# Patient Record
Sex: Female | Born: 2000 | Hispanic: Yes | Marital: Single | Smoking: Never smoker
Health system: Southern US, Community
[De-identification: ages and names within clinical notes are randomized; demographics above are authoritative.]

## PROBLEM LIST (undated history)

## (undated) DIAGNOSIS — J45909 Unspecified asthma, uncomplicated: Secondary | ICD-10-CM

## (undated) HISTORY — DX: Unspecified asthma, uncomplicated: J45.909

---
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
## 2018-01-01 ENCOUNTER — Other Ambulatory Visit (RURAL_HEALTH_CENTER): Payer: Self-pay | Admitting: Nurse Practitioner

## 2018-01-01 ENCOUNTER — Ambulatory Visit: Payer: 59 | Attending: Nurse Practitioner | Admitting: Nurse Practitioner

## 2018-01-01 ENCOUNTER — Encounter (RURAL_HEALTH_CENTER): Payer: Self-pay | Admitting: Nurse Practitioner

## 2018-01-01 VITALS — BP 118/64 | HR 72 | Temp 98.6°F | Resp 20 | Ht 62.21 in | Wt 127.0 lb

## 2018-01-01 DIAGNOSIS — Z113 Encounter for screening for infections with a predominantly sexual mode of transmission: Secondary | ICD-10-CM

## 2018-01-01 DIAGNOSIS — L299 Pruritus, unspecified: Secondary | ICD-10-CM | POA: Insufficient documentation

## 2018-01-01 DIAGNOSIS — R233 Spontaneous ecchymoses: Secondary | ICD-10-CM | POA: Insufficient documentation

## 2018-01-01 DIAGNOSIS — F419 Anxiety disorder, unspecified: Secondary | ICD-10-CM

## 2018-01-01 DIAGNOSIS — Z30011 Encounter for initial prescription of contraceptive pills: Secondary | ICD-10-CM | POA: Insufficient documentation

## 2018-01-01 LAB — POCT PREGNANCY TEST, URINE HCG: POCT Pregnancy HCG Test, UR: NEGATIVE

## 2018-01-01 NOTE — Patient Instructions (Addendum)
Take celexa (citalopram) every day for anxiety. This may not start working until you have been taking it for 4 weeks.  Take atarax (hydroxizine) when you need it for panic attacks.    Take birth control every day.   USE A CONDOM WHENEVER YOU HAVE SEX TO PREVENT STDs!    Change your laundry detergent back to the old kind.

## 2018-01-01 NOTE — Progress Notes (Signed)
Bonnie Smith is present with two concerns: 1) she has been experiencing a reoccurrence of panic attacks, states that she was instructed by previous doctor to come to clinic and request medication if they should return. 2) the skin of her lower back and now hips has been very itching and she has noticed bruising from scratching.

## 2018-01-01 NOTE — Progress Notes (Signed)
HPI: Bonnie Smith is a 17 y.o. female who is here with several concerns.    She has a history of anxiety and panic attacks, which has been going on "for a while now." This was addressed at her last physical appointment but she was told that if her symptoms continued then she should come back to discuss medication options. Bonnie Smith states that she continues to have anxiety. She is becoming acutely anxious most days in many situations, particularly if the involve crowds or other people. She sometimes has a hard time at school, and she frequently has panic attacks at work. She finds herself avoiding situations as a result of this. She describes episodes of panic where she experiences tachycardia and palpitations, shortness of breath, and acute anxiety. She will often become very frustrated, angry, and cry. She has had outbursts at work as a result. These episodes last up to 10 minutes before resolving. She endorses some intermittent depressed mood and anhedonia. She states that she struggles with interpersonal relationships at home with her family. She has had thoughts of self-harm (cutting) in the past, the most recent being several months ago. She has not had any thoughts of suicide.    She has lost a significant amount of weight by "not eating." She states that she "used to be fat" and feels better about her body now. However, she continues to frequently skip meals or will eat very little in an attempt to maintain her weight.    Over the past 2 weeks she has had itching up her lower back and bilateral hips. She has been scratching so hard that she has now developed excoriation and bruising down her hips. She does not have itching anywhere else. She denies any vaginal or anal itching. Denies vaginal discharge or lesions. She has not had any known exposures. No family members are itching. She did recently change her laundry detergent.    She is sexually active with a new partner (about 2 weeks ago). She has had 4  lifetime partners. She reports using condoms with every sexual encounter. Her LMP was 3 weeks ago. She has regular menses without heavy bleeding or significant cramping.    She is interested in starting birth control. She has no history of headaches or migraine with aura. She has no family history of clotting disorders. She denies cigarette use but does endorse frequent vaping.    ROS:  See HPI    Review of Systems   Constitutional: Negative.    HENT: Negative.    Eyes: Negative.    Respiratory: Negative.    Cardiovascular: Negative.    Gastrointestinal: Negative.    Endocrine: Negative.    Genitourinary: Negative.    Musculoskeletal: Negative.    Skin: Positive for rash and wound. Negative for color change and pallor.   Neurological: Negative.    Psychiatric/Behavioral: Positive for dysphoric mood and self-injury. Negative for agitation, sleep disturbance and suicidal ideas. The patient is nervous/anxious.        Past medical history, family history, and social history were all reviewed per the chart.    Physical Exam:    Vitals:    01/01/18 1516   BP: 118/64   Pulse: 72   Resp: 20   Temp: 98.6 F (37 C)       Physical Exam   Constitutional: She is oriented to person, place, and time. She appears well-developed and well-nourished. No distress.   HENT:   Head: Normocephalic and atraumatic.   Right Ear: External  ear normal.   Left Ear: External ear normal.   Nose: Nose normal.   Mouth/Throat: Oropharynx is clear and moist. No oropharyngeal exudate.   Eyes: Pupils are equal, round, and reactive to light. Conjunctivae and EOM are normal.   Neck: Normal range of motion. Neck supple. No thyromegaly present.   Cardiovascular: Normal rate, regular rhythm and normal heart sounds.  Exam reveals no gallop and no friction rub.    No murmur heard.  Pulmonary/Chest: Effort normal and breath sounds normal. No respiratory distress. She has no wheezes. She has no rales.   Abdominal: Soft. Bowel sounds are normal. She exhibits no  distension. There is no tenderness.   Musculoskeletal: Normal range of motion.   Lymphadenopathy:     She has no cervical adenopathy.   Neurological: She is alert and oriented to person, place, and time.   Skin: Skin is warm and dry. Abrasion, petechiae and rash noted.   Bilateral hips and lateral buttocks with significant excoriation and petechiae   Psychiatric: She has a normal mood and affect. Her behavior is normal.       Assessment/Plan:  1. Anxiety     2. Petechiae  CBC and differential    Comprehensive Metabolic Panel    Lipid Panel    TSH, Abn Reflex to Free T4, Serum   3. Itching  CBC and differential    Comprehensive Metabolic Panel    Lipid Panel    TSH, Abn Reflex to Free T4, Serum   4. Encounter for initial prescription of contraceptive pills  POCT Pregnancy, urine   5. Screening examination for sexually transmitted disease  Chlamydia/GC BY PCR    HIV Ag/Ab 4th generation     Will start celexa daily for anxiety/depression. Discussed risks of SSRIs including increased risk for SI. Patient contracts for safety and agrees to seek help for any concerning changes in mood or SI.  Use atarax prn for acute anxiety/panic.    Reviewed options for birth control including OCPs, vaginal ring, depo provera, nexplanon and IUDs. Patient wishes to start OCPs at this time. Counseled on the importance of safe sex with barrier protection to prevent against STIs. Urine pregnancy negative today. Will screen for GC/chlaymidia and HIV today.    Will give topical steroid to help with pruritis. I suspect this is a contact dermatitis related to recent switch in laundry detergent. There may also be an anxiety component. Recommended switching back to the other laundry detergent. Call if symptoms worsen or do not improve with topical steroid.    Patient Instructions   Take celexa (citalopram) every day for anxiety. This may not start working until you have been taking it for 4 weeks.  Take atarax (hydroxizine) when you need it for  panic attacks.    Take birth control every day.   USE A CONDOM WHENEVER YOU HAVE SEX TO PREVENT STDs!    Change your laundry detergent back to the old kind.      Return in about 1 month (around 02/01/2018).

## 2018-01-02 LAB — CBC AND DIFFERENTIAL
Baso(Absolute): 0 10*3/uL (ref 0.0–0.2)
Basos: 0 %
Eos: 2 %
Eosinophils Absolute: 0.2 10*3/uL (ref 0.0–0.4)
Hematocrit: 41.6 % (ref 34.0–46.6)
Hemoglobin: 14.5 g/dL (ref 11.1–15.9)
Immature Granulocytes Absolute: 0 10*3/uL (ref 0.0–0.1)
Immature Granulocytes: 0 %
Lymphocytes Absolute: 3.2 10*3/uL — ABNORMAL HIGH (ref 0.7–3.1)
Lymphocytes: 34 %
MCH: 30.7 pg (ref 26.6–33.0)
MCHC: 34.9 g/dL (ref 31.5–35.7)
MCV: 88 fL (ref 79–97)
Monocytes Absolute: 0.5 10*3/uL (ref 0.1–0.9)
Monocytes: 5 %
Neutrophils Absolute: 5.4 10*3/uL (ref 1.4–7.0)
Neutrophils: 59 %
Platelets: 244 10*3/uL (ref 150–379)
RBC: 4.72 x10E6/uL (ref 3.77–5.28)
RDW: 12.9 % (ref 12.3–15.4)
WBC: 9.3 10*3/uL (ref 3.4–10.8)

## 2018-01-02 LAB — COMPREHENSIVE METABOLIC PANEL
ALT: 14 IU/L (ref 0–32)
AST (SGOT): 17 IU/L (ref 0–40)
Albumin/Globulin Ratio: 1.7 (ref 1.2–2.2)
Albumin: 4.7 g/dL (ref 3.5–5.5)
Alkaline Phosphatase: 62 IU/L (ref 43–101)
BUN / Creatinine Ratio: 14 (ref 9–23)
BUN: 9 mg/dL (ref 6–20)
Bilirubin, Total: 0.3 mg/dL (ref 0.0–1.2)
CO2: 23 mmol/L (ref 20–29)
Calcium: 9.5 mg/dL (ref 8.7–10.2)
Chloride: 101 mmol/L (ref 96–106)
Creatinine: 0.63 mg/dL (ref 0.57–1.00)
Globulin, Total: 2.7 g/dL (ref 1.5–4.5)
Glucose: 84 mg/dL (ref 65–99)
Potassium: 4.2 mmol/L (ref 3.5–5.2)
Protein, Total: 7.4 g/dL (ref 6.0–8.5)
Sodium: 141 mmol/L (ref 134–144)

## 2018-01-02 LAB — REFLEX - T4,FREE (DIRECT): T4, Free: 1.5 ng/dL (ref 0.93–1.60)

## 2018-01-02 LAB — LIPID PANEL
Cholesterol / HDL Ratio: 2.4 ratio (ref 0.0–4.4)
Cholesterol: 156 mg/dL (ref 100–169)
HDL: 65 mg/dL (ref >39)
LDL Calculated: 83 mg/dL (ref 0–109)
Triglycerides: 42 mg/dL (ref 0–89)
VLDL Calculated: 8 mg/dL (ref 5–40)

## 2018-01-02 LAB — THYROID STIMULATING HORMONE (TSH), REFLEX ON ABNORMAL TO FREE T4, SERUM: TSH: 0.362 u[IU]/mL — ABNORMAL LOW (ref 0.450–4.500)

## 2018-01-02 LAB — HIV-1/2 AG/AB 4TH GEN. W/ REFLEX: HIV Screen 4th Generation wRfx: NONREACTIVE

## 2018-01-02 MED ORDER — HYDROCORTISONE 2.5 % EX CREA
TOPICAL_CREAM | Freq: Three times a day (TID) | CUTANEOUS | 0 refills | Status: DC
Start: 2018-01-02 — End: 2018-01-03

## 2018-01-02 MED ORDER — HYDROXYZINE HCL 10 MG PO TABS
10.00 mg | ORAL_TABLET | Freq: Three times a day (TID) | ORAL | 0 refills | Status: DC | PRN
Start: 2018-01-02 — End: 2018-02-05

## 2018-01-02 MED ORDER — CITALOPRAM HYDROBROMIDE 10 MG PO TABS
10.00 mg | ORAL_TABLET | Freq: Every day | ORAL | 0 refills | Status: DC
Start: 2018-01-02 — End: 2018-02-05

## 2018-01-02 MED ORDER — LEVONORGEST-ETH ESTRAD 91-DAY 0.15-0.03 &0.01 MG PO TABS
1.00 | ORAL_TABLET | Freq: Every day | ORAL | 0 refills | Status: AC
Start: 2018-01-02 — End: ?

## 2018-01-03 ENCOUNTER — Encounter (RURAL_HEALTH_CENTER): Payer: Self-pay | Admitting: Nurse Practitioner

## 2018-01-03 ENCOUNTER — Encounter (INDEPENDENT_AMBULATORY_CARE_PROVIDER_SITE_OTHER): Payer: Self-pay

## 2018-01-03 ENCOUNTER — Ambulatory Visit (INDEPENDENT_AMBULATORY_CARE_PROVIDER_SITE_OTHER): Payer: 59 | Admitting: Family Medicine

## 2018-01-03 VITALS — BP 121/71 | HR 83 | Temp 97.6°F | Resp 18 | Ht 63.0 in | Wt 126.0 lb

## 2018-01-03 DIAGNOSIS — Y9383 Activity, rough housing and horseplay: Secondary | ICD-10-CM

## 2018-01-03 DIAGNOSIS — W228XXA Striking against or struck by other objects, initial encounter: Secondary | ICD-10-CM

## 2018-01-03 DIAGNOSIS — Y9289 Other specified places as the place of occurrence of the external cause: Secondary | ICD-10-CM

## 2018-01-03 DIAGNOSIS — Y998 Other external cause status: Secondary | ICD-10-CM

## 2018-01-03 DIAGNOSIS — S060X0A Concussion without loss of consciousness, initial encounter: Secondary | ICD-10-CM

## 2018-01-03 NOTE — Patient Instructions (Addendum)
Concussion    A concussionis a type of brain injury.It can be caused by a directhit or blowto the head, neck, face, orbody. The force of the blowmakesthe headand brain shake quickly back and forth.In some cases youmaylose consciousness.Depending on the severity of the blow, it will take from a few hours up to a few days to get better. Sometimes symptoms may last a few months or longer. This is called post-concussion syndrome.  At first, you may have a headache, nausea, vomiting, or dizziness. You may also have problems concentrating or remembering things. This is normal.  Symptoms should get better as the hours and days go by. Symptoms that get worse could be a sign of a more seriousbraininjury. This might be a bruise or bleeding in the brain. That's why it's important to watch for the warning signs listed below.  School-age children are more at risk for symptoms that don't go away after a concussion. They should be watched very closely.  Home care  If your injury is mild and there are no serious signs or symptoms, your healthcare provider may recommend that you be watchedat home. If there is evidence that the injury is more serious, you will be watchedin the hospital. Follow these tips to help care for yourself at home:   After a concussion, your healthcare provider may recommend that a family member or friend watchedyou for 12 to 24 hours. They may be told to wake you every few hours during sleep to check for the signs below.   If your face or scalp swells, apply an ice pack for 20 minutes every 1 to 2 hours. Do this until the swelling starts to go down. To make an ice pack, put ice cubes in a plastic bag that seals at the top. Wrap the bag in a clean, thin towel or cloth. Never put ice or an ice pack directly on the skin.   You may use acetaminophen to control pain, unless another pain medicine was prescribed. Don't use aspirin or ibuprofen after a head injury.If you have long-lasting  (chronic)liver or kidney disease, talk with your healthcare providerbefore using these medicines. Also talk with your providerif you ever had a stomach ulcer orgastrointestinal bleeding.   For the next 24 hours:  ? Don't drink alcohol or take sedatives or medicines that make you sleepy.  ? Don't drive or operate machinery.  ? Don't do anything strenuous. Don't lift or strain.   Don't returnright awayto sports ortoany activitywhere you couldhit your head. Waituntil all symptoms are gone and you have been cleared by yourhealthcare provider.Having asecond head injury beforeyoufully recoverfrom the first one can lead to serious brain injury.   After a few days, it's OK to go back to your normal daily activities. But don't do anything that could cause your head to be hit again.  Follow-up care  Follow up with your healthcare providerin 1 week, or as directed.  A radiologist will review any X-rays or CT scans that were taken. You will be told of any new findings that may affect your care.  When to seek medical advice  Call your healthcare provider right awayif any of these occur:   Headacheor dizzinessthat won't go away   Redness, warmth, or pus from the swollen area  Call 911  Call 911 or get medical care right away if any of these occur:   Repeated vomiting (it's common to vomitonce after a head injury)   Headache or dizziness that is severe or gets   worse   Loss of consciousness   Unusual drowsiness, or unable to wake up as usual   Weakness or decreased ability to walk or move any limb   Confusion, agitation,or change in behavior or speech, or memory loss   Blurred vision   Convulsion (seizure)   Swelling on the scalp or face that gets worse   Changes in pupil size (the black part of the eye)   Fluid draining from or bleeding from the nose or ears  Date Last Reviewed: 03/22/2017   2000-2018 [employer] [address], [place] [address]. All rights reserved.  This information is not intended as a substitute for professional medical care. Always follow your healthcare professional's instructions.

## 2018-01-03 NOTE — Progress Notes (Signed)
Subjective:    Patient ID: Bonnie Smith is a 17 y.o. female.    Head Injury    The incident occurred 12 to 24 hours ago. The injury mechanism was a direct blow (was wrestling with her boyfriend and got headbutted ). There was no loss of consciousness. There was no blood loss. The quality of the pain is described as aching and dull. The pain is moderate. The pain has been constant since the injury. Associated symptoms include headaches. Pertinent negatives include no blurred vision, disorientation, memory loss, numbness, tinnitus, vomiting or weakness. She has tried acetaminophen for the symptoms. The treatment provided mild relief.       The following portions of the patient's history were reviewed and updated as appropriate: allergies, current medications, past medical history, past social history, past surgical history and problem list.    Review of Systems   Constitutional: Negative for activity change, appetite change and fatigue.   HENT: Negative for tinnitus.    Eyes: Negative for blurred vision and pain.   Respiratory: Negative for chest tightness and shortness of breath.    Cardiovascular: Negative for chest pain.   Gastrointestinal: Negative for vomiting.   Musculoskeletal: Negative for back pain.   Skin: Negative for rash.   Neurological: Positive for headaches. Negative for dizziness, weakness and numbness.   Psychiatric/Behavioral: Negative for confusion and memory loss.         Objective:      BP 121/71   Pulse 83   Temp 97.6 F (36.4 C) (Oral)   Resp 18   Ht 1.6 m (5\' 3" )   Wt 57.2 kg (126 lb)   LMP 12/20/2017   BMI 22.32 kg/m     Physical Exam   Constitutional: She is oriented to person, place, and time. She appears well-developed and well-nourished. No distress.   HENT:   Head: Normocephalic and atraumatic.   Right Ear: Tympanic membrane, external ear and ear canal normal.   Left Ear: Tympanic membrane, external ear and ear canal normal.   Nose: Nose normal.   Mouth/Throat: Uvula is  midline, oropharynx is clear and moist and mucous membranes are normal.   Eyes: Conjunctivae and EOM are normal.   Neck: Normal range of motion. Neck supple.   Cardiovascular: Normal rate and regular rhythm.    Pulmonary/Chest: Effort normal and breath sounds normal. No respiratory distress. She has no wheezes. She has no rales.   Musculoskeletal: Normal range of motion.   Lymphadenopathy:     She has no cervical adenopathy.   Neurological: She is alert and oriented to person, place, and time. She has normal strength. She is not disoriented. She displays no atrophy, no tremor and normal reflexes. No cranial nerve deficit or sensory deficit. She exhibits normal muscle tone. She displays a negative Romberg sign. Coordination and gait normal. GCS eye subscore is 4. GCS verbal subscore is 5. GCS motor subscore is 6.   Skin: Skin is warm and dry. She is not diaphoretic.   Psychiatric: She has a normal mood and affect. Her speech is normal and behavior is normal. Thought content normal. Cognition and memory are normal.   Nursing note and vitals reviewed.        Assessment:       1. Concussion without loss of consciousness, initial encounter          Plan:       SAC score 30/30; no red flag s/s at this time; no indication for CT based on  PECARN guidelines  Advised rest and fluids; discussed appropriate otc sx tx for use prn. Advised no contact sports until sxs resolved  Discussed concerning s/s for which I have advised the patient to go immediately to the Emergency Department.   Follow up with PCP or RTC if there are any new or worsening symptoms or if the symptoms are lasting longer than expected.  Patient/guardian expressed understanding and agreement with plan of care at time of discharge.         Procedures

## 2018-01-04 LAB — CHLAMYDIA GONORRHOEAE NAA
CHLAMYDIA TRACHOMATIS, NAA: NEGATIVE
Neisseria gonorrhoeae, NAA: NEGATIVE

## 2018-01-06 ENCOUNTER — Telehealth (INDEPENDENT_AMBULATORY_CARE_PROVIDER_SITE_OTHER): Payer: Self-pay

## 2018-01-06 NOTE — Telephone Encounter (Signed)
Called to check on patient after recent visit.    Spoke with patient states she is doing good. No further questions at this time.

## 2018-02-05 ENCOUNTER — Ambulatory Visit: Payer: 59 | Attending: Nurse Practitioner | Admitting: Nurse Practitioner

## 2018-02-05 ENCOUNTER — Encounter (RURAL_HEALTH_CENTER): Payer: Self-pay | Admitting: Nurse Practitioner

## 2018-02-05 VITALS — BP 120/62 | HR 72 | Temp 98.6°F | Resp 14 | Ht 62.6 in | Wt 129.0 lb

## 2018-02-05 DIAGNOSIS — F419 Anxiety disorder, unspecified: Secondary | ICD-10-CM

## 2018-02-05 MED ORDER — CITALOPRAM HYDROBROMIDE 20 MG PO TABS
20.00 mg | ORAL_TABLET | Freq: Every day | ORAL | 3 refills | Status: AC
Start: 2018-02-05 — End: ?

## 2018-02-05 MED ORDER — HYDROXYZINE HCL 10 MG PO TABS
10.00 mg | ORAL_TABLET | Freq: Three times a day (TID) | ORAL | 0 refills | Status: DC | PRN
Start: 2018-02-05 — End: 2018-03-06

## 2018-02-05 NOTE — Progress Notes (Signed)
HPI: Bonnie Smith is a 17 y.o. female who is here for a medication check. Since starting the celexa she's been doing better. The atarax is helpful for panic. She takes it a few times per week at work. She feels like her anxiety is "still there" but more under control. Denies depressed mood, anhedonia, thoughts of self-harm or suicide. She denies any concerning side effects. No headaches, upset stomach. She is sleeping well. The celexa seems to have improved her sleep.    She is doing well on the new OCPs without side effects.      ROS:  See HPI    Review of Systems    Past medical history, family history, and social history were all reviewed per the chart.    Physical Exam:    Vitals:    02/05/18 1421   BP: 120/62   Pulse: 72   Resp: 14   Temp: 98.6 F (37 C)       Physical Exam   Constitutional: She is oriented to person, place, and time. She appears well-developed and well-nourished. No distress.   HENT:   Head: Normocephalic and atraumatic.   Right Ear: External ear normal.   Left Ear: External ear normal.   Nose: Nose normal.   Mouth/Throat: Oropharynx is clear and moist. No oropharyngeal exudate.   Eyes: Pupils are equal, round, and reactive to light. Conjunctivae and EOM are normal.   Neck: Normal range of motion. Neck supple. No thyromegaly present.   Cardiovascular: Normal rate, regular rhythm and normal heart sounds.  Exam reveals no gallop and no friction rub.    No murmur heard.  Pulmonary/Chest: Effort normal and breath sounds normal. No respiratory distress. She has no wheezes. She has no rales.   Abdominal: Soft. Bowel sounds are normal. She exhibits no distension. There is no tenderness.   Musculoskeletal: Normal range of motion.   Lymphadenopathy:     She has no cervical adenopathy.   Neurological: She is alert and oriented to person, place, and time.   Skin: Skin is warm and dry. No rash noted.   Psychiatric: She has a normal mood and affect. Her behavior is normal.       Assessment/Plan:  1.  Anxiety  citalopram (CELEXA) 20 MG tablet    hydrOXYzine (ATARAX) 10 MG tablet     Will increase celexa given good but incomplete response. Continue atarax as needed.    Follow-up in one month and we will recheck thyroid at that visit.    Return in about 1 month (around 03/07/2018) for medication check.

## 2018-03-06 ENCOUNTER — Other Ambulatory Visit (RURAL_HEALTH_CENTER): Payer: Self-pay | Admitting: Nurse Practitioner

## 2018-03-06 ENCOUNTER — Ambulatory Visit: Payer: 59 | Attending: Nurse Practitioner | Admitting: Nurse Practitioner

## 2018-03-06 ENCOUNTER — Encounter (RURAL_HEALTH_CENTER): Payer: Self-pay | Admitting: Nurse Practitioner

## 2018-03-06 VITALS — BP 110/60 | HR 88 | Temp 98.6°F | Resp 20 | Wt 130.0 lb

## 2018-03-06 DIAGNOSIS — F410 Panic disorder [episodic paroxysmal anxiety] without agoraphobia: Secondary | ICD-10-CM

## 2018-03-06 DIAGNOSIS — R899 Unspecified abnormal finding in specimens from other organs, systems and tissues: Secondary | ICD-10-CM

## 2018-03-06 DIAGNOSIS — F419 Anxiety disorder, unspecified: Secondary | ICD-10-CM

## 2018-03-06 DIAGNOSIS — F411 Generalized anxiety disorder: Secondary | ICD-10-CM

## 2018-03-06 MED ORDER — HYDROXYZINE HCL 10 MG PO TABS
10.00 mg | ORAL_TABLET | Freq: Three times a day (TID) | ORAL | 3 refills | Status: AC | PRN
Start: 2018-03-06 — End: ?

## 2018-03-06 NOTE — Progress Notes (Signed)
HPI: Bonnie Smith is a 17 y.o. female who is here for a medication check. Since increasing the celexa she hasn't noticed much of a difference. She feels like her symptoms of anxiety and panic have worsened.  Having increased panic attacks, particularly at work. She gets panicky when she is hot or when things get busy at work. Her heart races, she starts shaking and crying, will often have to go into the walk-in freezer to "cool down." Atarax is helpful. She's taking this every day that she works.  There has been an increase in stress at home. There are strained family relationships.    ROS:  See HPI    Review of Systems    Past medical history, family history, and social history were all reviewed per the chart.    Physical Exam:    Vitals:    03/06/18 1415   BP: 110/60   Pulse: 88   Resp: 20   Temp: 98.6 F (37 C)       Physical Exam   Constitutional: She is oriented to person, place, and time. She appears well-developed and well-nourished. No distress.   HENT:   Head: Normocephalic and atraumatic.   Right Ear: External ear normal.   Left Ear: External ear normal.   Nose: Nose normal.   Mouth/Throat: Oropharynx is clear and moist. No oropharyngeal exudate.   Eyes: Pupils are equal, round, and reactive to light. Conjunctivae and EOM are normal.   Neck: Normal range of motion. Neck supple. No thyromegaly present.   Cardiovascular: Normal rate, regular rhythm and normal heart sounds.  Exam reveals no gallop and no friction rub.    No murmur heard.  Pulmonary/Chest: Effort normal and breath sounds normal. No respiratory distress. She has no wheezes. She has no rales.   Abdominal: Soft. Bowel sounds are normal. She exhibits no distension. There is no tenderness.   Musculoskeletal: Normal range of motion.   Lymphadenopathy:     She has no cervical adenopathy.   Neurological: She is alert and oriented to person, place, and time.   Skin: Skin is warm and dry. No rash noted.   Psychiatric: She has a normal mood and  affect. Her behavior is normal.       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 Abnormal laboratory test result  TSH, Abn Reflex to Free T4, Serum   3. Anxiety  hydrOXYzine (ATARAX) 10 MG tablet     We need to recheck thyroid as she had a low TSH but normal T4 when we checked 2 months ago. 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  I am going to refer to counseling to help with anxiety and panic, particularly given her home stressors.  Continue celexa daily and atarax prn.

## 2018-03-06 NOTE — Progress Notes (Signed)
Bonnie Smith is present for a med check. She is currently taking citalopram 20 mg and hydroxyzine 10 mg. She states that her panic attacks are getting worse.

## 2018-03-07 LAB — THYROID STIMULATING HORMONE (TSH), REFLEX ON ABNORMAL TO FREE T4, SERUM: TSH: 0.563 u[IU]/mL (ref 0.450–4.500)

## 2018-07-01 ENCOUNTER — Encounter (INDEPENDENT_AMBULATORY_CARE_PROVIDER_SITE_OTHER): Payer: Self-pay

## 2018-07-01 ENCOUNTER — Ambulatory Visit (INDEPENDENT_AMBULATORY_CARE_PROVIDER_SITE_OTHER): Payer: Medicaid Other

## 2018-07-01 DIAGNOSIS — Z029 Encounter for administrative examinations, unspecified: Secondary | ICD-10-CM

## 2018-07-10 NOTE — Progress Notes (Signed)
[facility]- wait time too long

## 2019-11-15 ENCOUNTER — Emergency Department (HOSPITAL_COMMUNITY): Payer: No Typology Code available for payment source

## 2019-11-15 ENCOUNTER — Emergency Department (HOSPITAL_COMMUNITY)
Admission: EM | Admit: 2019-11-15 | Discharge: 2019-11-15 | Disposition: A | Discharge status: Home / Self Care | Payer: No Typology Code available for payment source | Attending: Emergency Medicine | Admitting: Emergency Medicine

## 2019-11-15 ENCOUNTER — Encounter (HOSPITAL_COMMUNITY): Payer: Self-pay

## 2019-11-15 ENCOUNTER — Other Ambulatory Visit: Payer: Self-pay

## 2019-11-15 DIAGNOSIS — Z23 Encounter for immunization: Secondary | ICD-10-CM | POA: Diagnosis not present

## 2019-11-15 DIAGNOSIS — W01198A Fall on same level from slipping, tripping and stumbling with subsequent striking against other object, initial encounter: Secondary | ICD-10-CM | POA: Diagnosis not present

## 2019-11-15 DIAGNOSIS — Y9389 Activity, other specified: Secondary | ICD-10-CM | POA: Diagnosis not present

## 2019-11-15 DIAGNOSIS — S81012A Laceration without foreign body, left knee, initial encounter: Secondary | ICD-10-CM | POA: Diagnosis not present

## 2019-11-15 DIAGNOSIS — Y999 Unspecified external cause status: Secondary | ICD-10-CM | POA: Diagnosis not present

## 2019-11-15 DIAGNOSIS — M79641 Pain in right hand: Secondary | ICD-10-CM | POA: Insufficient documentation

## 2019-11-15 DIAGNOSIS — Y9289 Other specified places as the place of occurrence of the external cause: Secondary | ICD-10-CM | POA: Insufficient documentation

## 2019-11-15 DIAGNOSIS — M79644 Pain in right finger(s): Secondary | ICD-10-CM

## 2019-11-15 MED ORDER — DOXYCYCLINE HYCLATE 100 MG PO TABS
100.0000 mg | ORAL_TABLET | Freq: Once | ORAL | Status: AC
  Administered 2019-11-15: 100 mg via ORAL
  Filled 2019-11-15: qty 1

## 2019-11-15 MED ORDER — TETANUS-DIPHTH-ACELL PERTUSSIS 5-2.5-18.5 LF-MCG/0.5 IM SUSP
0.5000 mL | Freq: Once | INTRAMUSCULAR | Status: AC
  Administered 2019-11-15: 0.5 mL via INTRAMUSCULAR
  Filled 2019-11-15: qty 0.5

## 2019-11-15 MED ORDER — LIDOCAINE-EPINEPHRINE (PF) 2 %-1:200000 IJ SOLN
20.0000 mL | Freq: Once | INTRAMUSCULAR | Status: AC
  Administered 2019-11-15: 20 mL
  Filled 2019-11-15: qty 20

## 2019-11-15 MED ORDER — DOXYCYCLINE HYCLATE 100 MG PO CAPS: 100.0000 mg | ORAL_CAPSULE | Freq: Two times a day (BID) | ORAL | 0 refills | Status: AC

## 2019-11-15 NOTE — ED Triage Notes (Signed)
Patient complains of left knee pain with large leg laceration after falling on metal. Saline dressing applied and minimal bleeding.

## 2019-11-15 NOTE — Discharge Instructions (Signed)
1. Medications: Tylenol or ibuprofen for pain 2. Treatment: ice for swelling, keep wound clean with warm soap and water and keep bandage dry. Wear knee immobilizer when walking 3. Follow Up: Please return in 10 days to have your stitches removed or sooner if you have concerns. Return to the emergency department for increased redness, drainage of pus from the wound   WOUND CARE  Remove bandage and wash wound gently with mild soap and warm water.   Continue daily cleansing with soap and water until stitches are removed.  Do not apply any ointments or creams to the wound while stitches are in place, as this may cause delayed healing. Return if you experience any of the following signs of infection: Swelling, redness, pus drainage, streaking, fever >101.0 F  Return if you experience excessive bleeding that does not stop after 15-20 minutes of constant, firm pressure.

## 2019-11-15 NOTE — ED Provider Notes (Signed)
[facility] EMERGENCY DEPARTMENT Provider Note   CSN: [id number] Arrival date & time: 11/15/19  1441     History No chief complaint on file.   Annette Spence is a 19 y.o. female presenting for evaluation of knee laceration.  Patient states approximately 1 hour prior to arrival she tripped over a box, falling forward and landing on her left knee.  Her left knee was cut by something metal.  She denies hitting her head or loss of consciousness.  Patient states she also injured her right middle finger.  She denies injury elsewhere.  She has been scared to ambulate since, but denies significant pain.  She has not taken anything for pain including Tylenol ibuprofen.  She denies numbness or tingling.  She has no medical problems, takes medications daily.  She is not on blood thinners.  She does not know when her last tetanus shot was updated.  HPI     History reviewed. No pertinent past medical history.  There are no problems to display for this patient.   History reviewed. No pertinent surgical history.   OB History   No obstetric history on file.     No family history on file.  Social History   Tobacco Use  . Smoking status: Not on file  Substance Use Topics  . Alcohol use: Not on file  . Drug use: Not on file    Home Medications Prior to Admission medications   Medication Sig Start Date End Date Taking? Authorizing Provider  doxycycline (VIBRAMYCIN) 100 MG capsule Take 1 capsule (100 mg total) by mouth 2 (two) times daily for 5 days. 11/15/19 11/20/19  Karrina Lye, PA-C    Allergies    Patient has no known allergies.  Review of Systems   Review of Systems  Skin: Positive for wound.  Neurological: Negative for numbness.  Hematological: Does not bruise/bleed easily.    Physical Exam Updated Vital Signs BP 112/68   Pulse 67   Temp 97.8 F (36.6 C) (Oral)   Resp 14   Ht 5\' 3"  (1.6 m)   Wt 56.7 kg   LMP 10/29/2019 (Approximate)   SpO2  100%   BMI 22.14 kg/m   Physical Exam Vitals and nursing note reviewed.  Constitutional:      General: She is not in acute distress.    Appearance: She is well-developed.  HENT:     Head: Normocephalic and atraumatic.  Cardiovascular:     Rate and Rhythm: Normal rate and regular rhythm.     Pulses: Normal pulses.  Pulmonary:     Effort: Pulmonary effort is normal. No respiratory distress.     Breath sounds: Normal breath sounds. No wheezing.  Abdominal:     General: There is no distension.  Musculoskeletal:        General: Normal range of motion.     Cervical back: Normal range of motion.     Comments: Full active range of motion of the knee without difficulty.  2+ pedal pulses bilaterally.  Able to perform straight leg raise without difficulty. Ambulatory without difficulty.  Good distal sensation and cap refill.  Skin:    General: Skin is warm and dry.     Capillary Refill: Capillary refill takes less than 2 seconds.     Comments: Approximately 5 cm laceration of the anterior right lower extremity just distal to the knee.  Laceration approximately 1 cm deep.  No active bleeding.  Neurological:     Mental Status: She  is alert and oriented to person, place, and time.     ED Results / Procedures / Treatments   Labs (all labs ordered are listed, but only abnormal results are displayed) Labs Reviewed - No data to display  EKG None  Radiology DG Knee Complete 4 Views Left  Result Date: 11/15/2019 CLINICAL DATA:  Status post trauma. EXAM: LEFT KNEE - COMPLETE 4+ VIEW COMPARISON:  None. FINDINGS: No evidence of fracture, dislocation, or joint effusion. No evidence of arthropathy or other focal bone abnormality. A 0.8 cm x 1.1 cm superficial soft tissue defect is seen along the anterior aspect of the left knee, below the left patella. No radiopaque soft tissue foreign bodies are identified. IMPRESSION: 1. Superficial soft tissue laceration seen inferior to the left patella,  without evidence of an acute osseous abnormality or radiopaque soft tissue foreign body. Electronically Signed   By: Aram Candela M.D.   On: 11/15/2019 16:18   DG Hand Complete Right  Result Date: 11/15/2019 CLINICAL DATA:  Status post trauma. EXAM: RIGHT HAND - COMPLETE 3+ VIEW COMPARISON:  None. FINDINGS: There is no evidence of fracture or dislocation. There is no evidence of arthropathy. A 5 mm bone island is seen within the distal aspect of the proximal phalanx of the fourth right finger. Soft tissues are unremarkable. IMPRESSION: 1. No acute osseous abnormality. Electronically Signed   By: Aram Candela M.D.   On: 11/15/2019 16:34    Procedures .Marland KitchenLaceration Repair  Date/Time: 11/15/2019 5:42 PM Performed by: Alveria Apley, PA-C Authorized by: Alveria Apley, PA-C   Consent:    Consent obtained:  Verbal   Consent given by:  Patient   Risks discussed:  Infection, need for additional repair, nerve damage, poor wound healing, poor cosmetic result, pain, retained foreign body, tendon damage and vascular damage Anesthesia (see MAR for exact dosages):    Anesthesia method:  Local infiltration   Local anesthetic:  Lidocaine 2% WITH epi Laceration details:    Location:  Leg   Leg location:  L knee   Length (cm):  6   Depth (mm):  1 Repair type:    Repair type:  Intermediate Pre-procedure details:    Preparation:  Patient was prepped and draped in usual sterile fashion and imaging obtained to evaluate for foreign bodies Exploration:    Wound exploration: wound explored through full range of motion and entire depth of wound probed and visualized     Wound extent: no foreign bodies/material noted, no nerve damage noted, no tendon damage noted, no underlying fracture noted and no vascular damage noted   Treatment:    Area cleansed with:  Saline   Amount of cleaning:  Standard   Irrigation solution:  Sterile water   Irrigation volume:  500   Irrigation method:   Syringe Skin repair:    Repair method:  Sutures   Suture size:  4-0   Wound skin closure material used: vicryl.   Suture technique:  Simple interrupted and vertical mattress   Number of sutures: 4 vertical mattress, 3 simple interrupted. Approximation:    Approximation:  Close Post-procedure details:    Dressing:  Splint for protection and sterile dressing   Patient tolerance of procedure:  Tolerated well, no immediate complications   (including critical care time)  Medications Ordered in ED Medications  Tdap (BOOSTRIX) injection 0.5 mL (0.5 mLs Intramuscular Given 11/15/19 1633)  doxycycline (VIBRA-TABS) tablet 100 mg (100 mg Oral Given 11/15/19 1633)  lidocaine-EPINEPHrine (XYLOCAINE W/EPI) 2 %-1:200000 (PF) injection  20 mL (20 mLs Infiltration Given 11/15/19 1633)    ED Course  I have reviewed the triage vital signs and the nursing notes.  Pertinent labs & imaging results that were available during my care of the patient were reviewed by me and considered in my medical decision making (see chart for details).    MDM Rules/Calculators/A&P                      Pt presenting for evaluation of left knee laceration.  Physical exam shows patient is neurovascularly intact.  Full active range of motion the knee, as such doubt patellar tendon injury.  Will obtain x-rays due to length and depth of cut.  Tetanus updated today.  X-rays viewed and interpreted by me, no fracture or foreign body.  Wound was numbed and irrigated extensively.  No sign of tendon injury, damage, or encroachment into the capsule.  Laceration repaired as described above.  Discussed aftercare instructions.  Patient placed in knee immobilizer for protection.  At this time, patient appears safe for discharge.  Return precautions given.  Patient states she understands and agrees to plan.  Final Clinical Impression(s) / ED Diagnoses Final diagnoses:  Knee laceration, left, initial encounter  Pain of right middle finger     Rx / DC Orders ED Discharge Orders         Ordered    doxycycline (VIBRAMYCIN) 100 MG capsule  2 times daily     11/15/19 1745           Franchot Heidelberg, PA-C 11/15/19 1840    Davonna Belling, MD 11/15/19 1955

## 2021-02-13 ENCOUNTER — Other Ambulatory Visit: Payer: Self-pay

## 2021-02-13 ENCOUNTER — Ambulatory Visit
Admission: EM | Admit: 2021-02-13 | Discharge: 2021-02-13 | Disposition: A | Discharge status: Home / Self Care | Payer: Self-pay | Attending: Emergency Medicine | Admitting: Emergency Medicine

## 2021-02-13 ENCOUNTER — Encounter: Payer: Self-pay | Admitting: Emergency Medicine

## 2021-02-13 DIAGNOSIS — R519 Headache, unspecified: Secondary | ICD-10-CM

## 2021-02-13 DIAGNOSIS — R5383 Other fatigue: Secondary | ICD-10-CM

## 2021-02-13 DIAGNOSIS — R252 Cramp and spasm: Secondary | ICD-10-CM

## 2021-02-13 MED ORDER — NAPROXEN 500 MG PO TABS: 500.0000 mg | ORAL_TABLET | Freq: Two times a day (BID) | ORAL | 0 refills | Status: AC

## 2021-02-13 MED ORDER — HYDROXYZINE HCL 25 MG PO TABS: 25.0000 mg | ORAL_TABLET | Freq: Four times a day (QID) | ORAL | 0 refills | Status: AC | PRN

## 2021-02-13 NOTE — ED Provider Notes (Signed)
[name]    CSN: [id number] Arrival date & time: 02/13/21  8413      History   Chief Complaint Chief Complaint  Patient presents with  . Weakness  . Fatigue  . Dizziness  . Headache    HPI Annette Spence is a 20 y.o. female presenting today for evaluation of headache.  Reports over the past few years she has had episodes of feeling lightheaded and dizzy with associated fatigue.  Recently at work had worsening spells.  Reports episode yesterday where she felt as if she was about to pass out.  Reports associated shakiness which is constant with associated fatigue.  Reports associated hand cramping and noticed tips of fingers red and swollen at times.  She denies any URI symptoms.  Does report history of anxiety does feel more stressed recently.  Denies any specific triggers.  Denies changes in lifestyle including changes in diet, meds, supplements, sleep.  HPI  History reviewed. No pertinent past medical history.  There are no problems to display for this patient.   History reviewed. No pertinent surgical history.  OB History   No obstetric history on file.      Home Medications    Prior to Admission medications   Medication Sig Start Date End Date Taking? Authorizing Provider  hydrOXYzine (ATARAX/VISTARIL) 25 MG tablet Take 1 tablet (25 mg total) by mouth every 6 (six) hours as needed for anxiety. 02/13/21  Yes Ladelle Teodoro C, PA-C  naproxen (NAPROSYN) 500 MG tablet Take 1 tablet (500 mg total) by mouth 2 (two) times daily. 02/13/21  Yes Monik Lins, Junius Creamer, PA-C    Family History Family History  Problem Relation Age of Onset  . Hypertension Father     Social History Social History   Tobacco Use  . Smoking status: Never Smoker  . Smokeless tobacco: Never Used  Vaping Use  . Vaping Use: Every day  Substance Use Topics  . Alcohol use: Yes  . Drug use: Not Currently     Allergies   Patient has no known allergies.   Review of  Systems Review of Systems  Constitutional: Positive for fatigue. Negative for fever.  HENT: Negative for congestion, sinus pressure and sore throat.   Eyes: Negative for photophobia, pain and visual disturbance.  Respiratory: Negative for cough and shortness of breath.   Cardiovascular: Negative for chest pain.  Gastrointestinal: Negative for abdominal pain, nausea and vomiting.  Genitourinary: Negative for decreased urine volume and hematuria.  Musculoskeletal: Negative for myalgias, neck pain and neck stiffness.  Neurological: Positive for light-headedness and headaches. Negative for dizziness, syncope, facial asymmetry, speech difficulty, weakness and numbness.     Physical Exam Triage Vital Signs ED Triage Vitals  Enc Vitals Group     BP      Pulse      Resp      Temp      Temp src      SpO2      Weight      Height      Head Circumference      Peak Flow      Pain Score      Pain Loc      Pain Edu?      Excl. in GC?    Orthostatic VS for the past 24 hrs:  BP- Lying Pulse- Lying BP- Sitting Pulse- Sitting BP- Standing at 0 minutes Pulse- Standing at 0 minutes  02/13/21 0856 119/68 69 114/73 72 120/77 74  Updated Vital Signs BP 133/79 (BP Location: Left Arm)   Pulse 99   Temp 98.5 F (36.9 C) (Oral)   Resp 17   LMP 01/08/2021   SpO2 99%   Visual Acuity Right Eye Distance:   Left Eye Distance:   Bilateral Distance:    Right Eye Near:   Left Eye Near:    Bilateral Near:     Physical Exam Vitals and nursing note reviewed.  Constitutional:      Appearance: She is well-developed.     Comments: No acute distress  HENT:     Head: Normocephalic and atraumatic.     Ears:     Comments: Bilateral ears without tenderness to palpation of external auricle, tragus and mastoid, EAC's without erythema or swelling, TM's with good bony landmarks and cone of light. Non erythematous.     Nose: Nose normal.     Mouth/Throat:     Comments: Oral mucosa pink and moist,  no tonsillar enlargement or exudate. Posterior pharynx patent and nonerythematous, no uvula deviation or swelling. Normal phonation. Eyes:     Conjunctiva/sclera: Conjunctivae normal.  Cardiovascular:     Rate and Rhythm: Normal rate and regular rhythm.  Pulmonary:     Effort: Pulmonary effort is normal. No respiratory distress.     Comments: Breathing comfortably at rest, CTABL, no wheezing, rales or other adventitious sounds auscultated Abdominal:     General: There is no distension.  Musculoskeletal:        General: Normal range of motion.     Cervical back: Neck supple.  Skin:    General: Skin is warm and dry.  Neurological:     General: No focal deficit present.     Mental Status: She is alert and oriented to person, place, and time. Mental status is at baseline.     Cranial Nerves: No cranial nerve deficit.     Motor: No weakness.     Gait: Gait normal.      UC Treatments / Results  Labs (all labs ordered are listed, but only abnormal results are displayed) Labs Reviewed  CBC  BASIC METABOLIC PANEL  TSH  VITAMIN D 25 HYDROXY (VIT D DEFICIENCY, FRACTURES)    EKG   Radiology No results found.  Procedures Procedures (including critical care time)  Medications Ordered in UC Medications - No data to display  Initial Impression / Assessment and Plan / UC Course  I have reviewed the triage vital signs and the nursing notes.  Pertinent labs & imaging results that were available during my care of the patient were reviewed by me and considered in my medical decision making (see chart for details).     Checking CBC, BMP TSH and vitamin D to further evaluate episodic symptoms, possible anxiety related, providing hydroxyzine to use as needed, Naprosyn for headaches.  No neurodeficits on exam, patient stable at time of visit, recommending rest and fluids.  Discussed strict return precautions. Patient verbalized understanding and is agreeable with plan.  Final  Clinical Impressions(s) / UC Diagnoses   Final diagnoses:  Fatigue, unspecified type  Cramping of hands  Acute nonintractable headache, unspecified headache type     Discharge Instructions     Blood work pending-I will call if abnormal Rest and drink plenty of fluids May use hydroxyzine as needed for anxiety-will cause drowsiness, do not drive or work after taking Naprosyn twice daily as needed for headache Please follow-up with primary care if symptoms persisting, emergency room if symptoms progressing or  worsening    ED Prescriptions    Medication Sig Dispense Auth. Provider   hydrOXYzine (ATARAX/VISTARIL) 25 MG tablet Take 1 tablet (25 mg total) by mouth every 6 (six) hours as needed for anxiety. 12 tablet Makisha Marrin C, PA-C   naproxen (NAPROSYN) 500 MG tablet Take 1 tablet (500 mg total) by mouth 2 (two) times daily. 30 tablet Ladarrell Cornwall, [place] C, PA-C     PDMP not reviewed this encounter.   Lew Dawes, [place] 02/13/21 [phone]

## 2021-02-13 NOTE — ED Triage Notes (Signed)
Pt presents with lightheaded/ dizziness, fatigue and headaches. States shakiness, lightheadedness and fatigue has been going on for years, but headaches are a new symptom.

## 2021-02-13 NOTE — Discharge Instructions (Addendum)
Blood work pending-I will call if abnormal Rest and drink plenty of fluids May use hydroxyzine as needed for anxiety-will cause drowsiness, do not drive or work after taking Naprosyn twice daily as needed for headache Please follow-up with primary care if symptoms persisting, emergency room if symptoms progressing or worsening

## 2021-02-14 LAB — BASIC METABOLIC PANEL
BUN/Creatinine Ratio: 25 — ABNORMAL HIGH (ref 9–23)
BUN: 17 mg/dL (ref 6–20)
CO2: 20 mmol/L (ref 20–29)
Calcium: 9.2 mg/dL (ref 8.7–10.2)
Chloride: 101 mmol/L (ref 96–106)
Creatinine, Ser: 0.67 mg/dL (ref 0.57–1.00)
Glucose: 79 mg/dL (ref 65–99)
Potassium: 4.4 mmol/L (ref 3.5–5.2)
Sodium: 138 mmol/L (ref 134–144)
eGFR: 127 mL/min/{1.73_m2} (ref >59)

## 2021-02-14 LAB — CBC
Hematocrit: 44.9 % (ref 34.0–46.6)
Hemoglobin: 15 g/dL (ref 11.1–15.9)
MCH: 30.3 pg (ref 26.6–33.0)
MCHC: 33.4 g/dL (ref 31.5–35.7)
MCV: 91 fL (ref 79–97)
Platelets: 250 10*3/uL (ref 150–450)
RBC: 4.95 x10E6/uL (ref 3.77–5.28)
RDW: 12.6 % (ref 11.7–15.4)
WBC: 7.9 10*3/uL (ref 3.4–10.8)

## 2021-02-14 LAB — VITAMIN D 25 HYDROXY (VIT D DEFICIENCY, FRACTURES): Vit D, 25-Hydroxy: 30 ng/mL (ref 30.0–100.0)

## 2021-02-14 LAB — TSH: TSH: 0.376 u[IU]/mL — ABNORMAL LOW (ref 0.450–4.500)

## 2021-06-08 IMAGING — CR DG KNEE COMPLETE 4+V*L*
4 series · 4 of 4 positions shown · non-contrast
Comparison: None.

CLINICAL DATA: Status post trauma.

EXAM:
LEFT KNEE - COMPLETE 4+ VIEW

[knee ap]
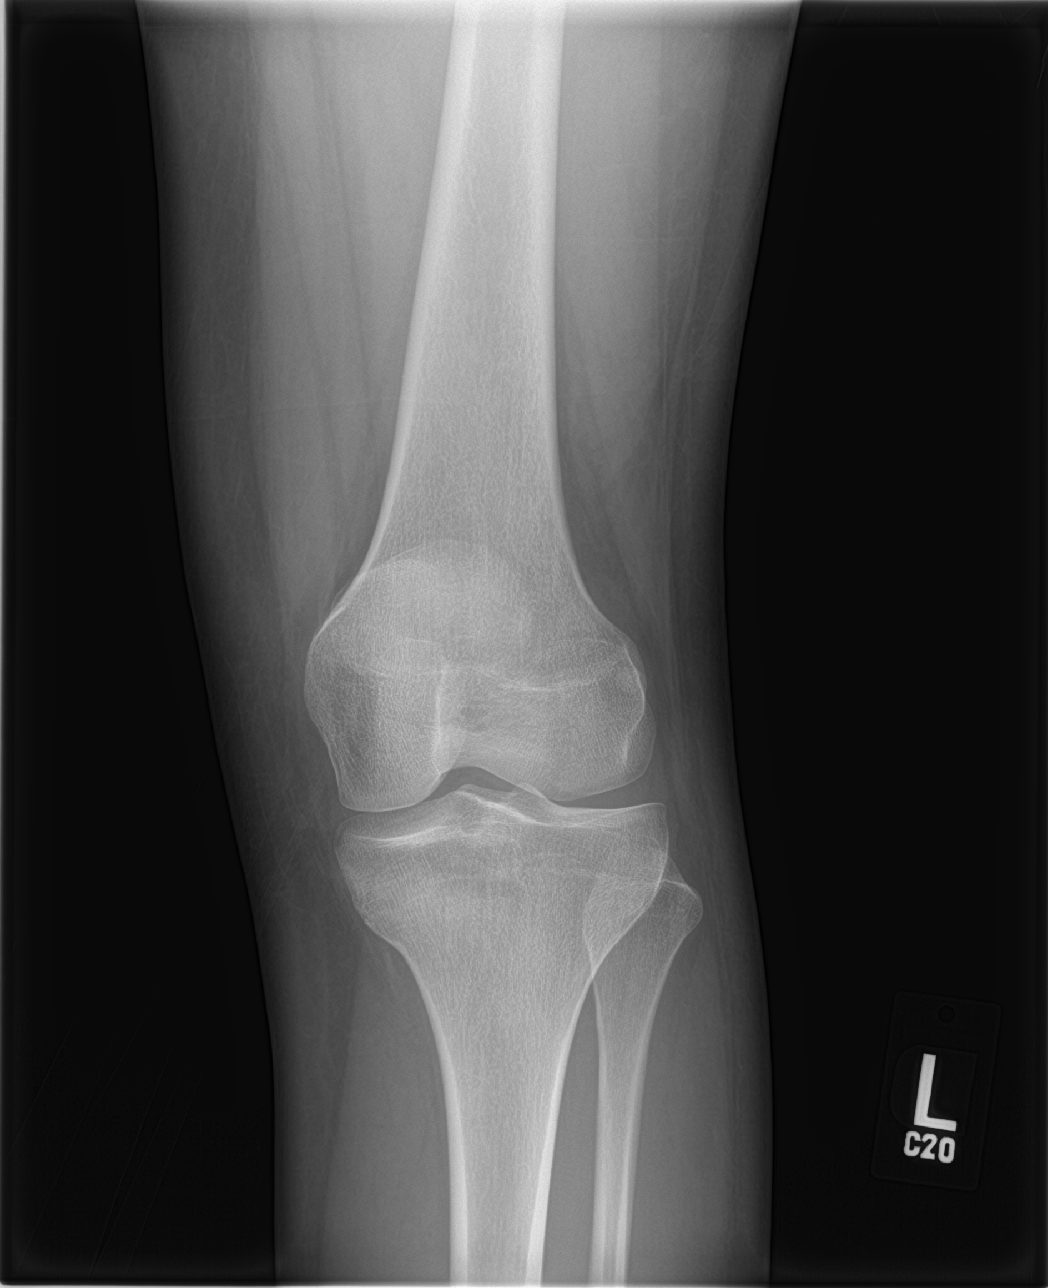

[knee lat]
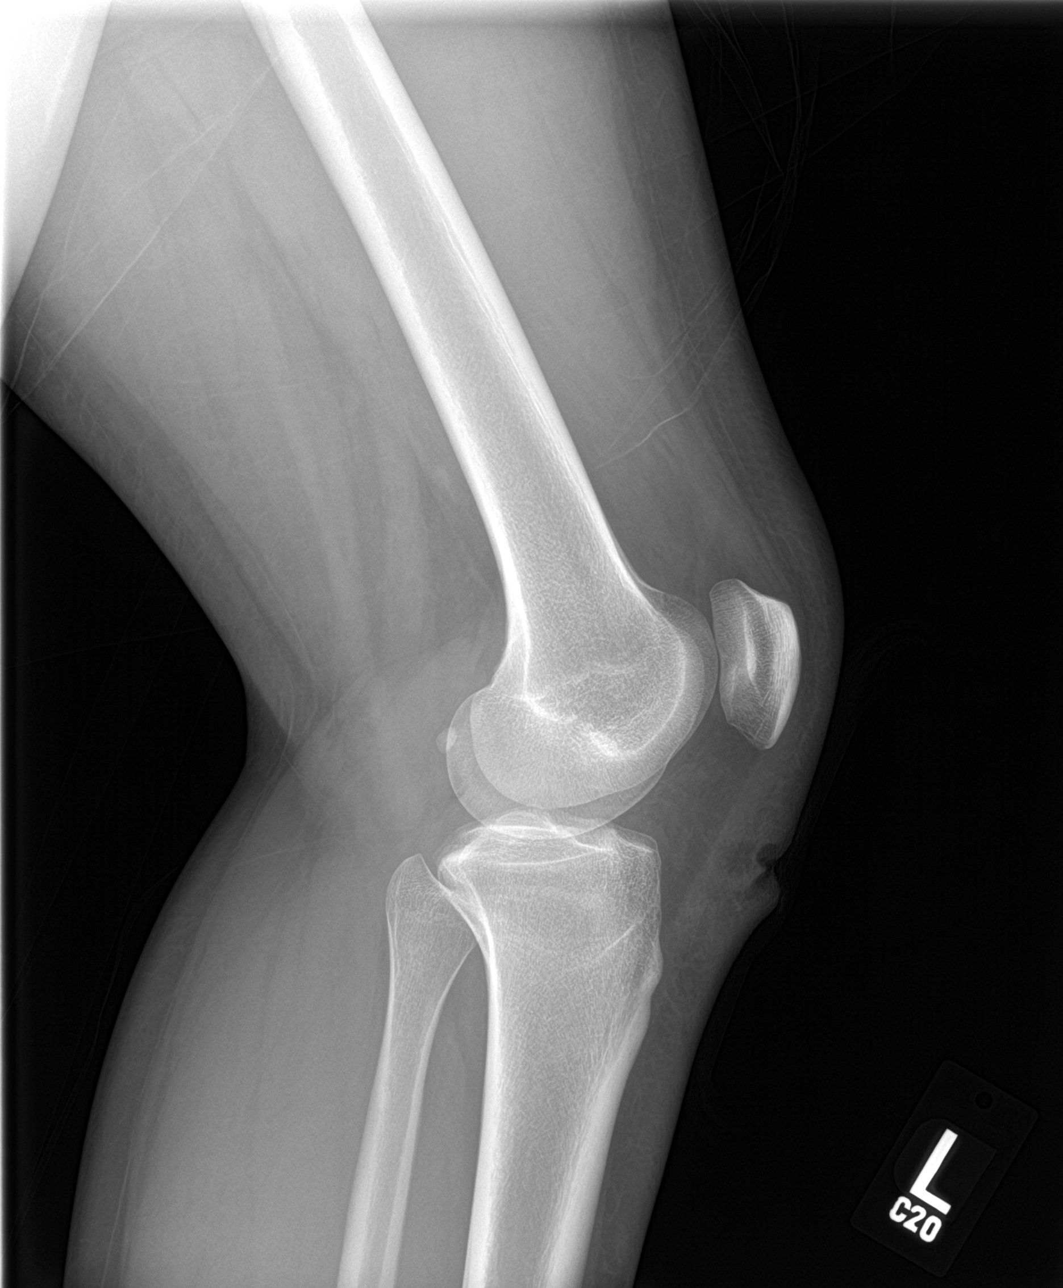

[knee obl (1 of 2)]
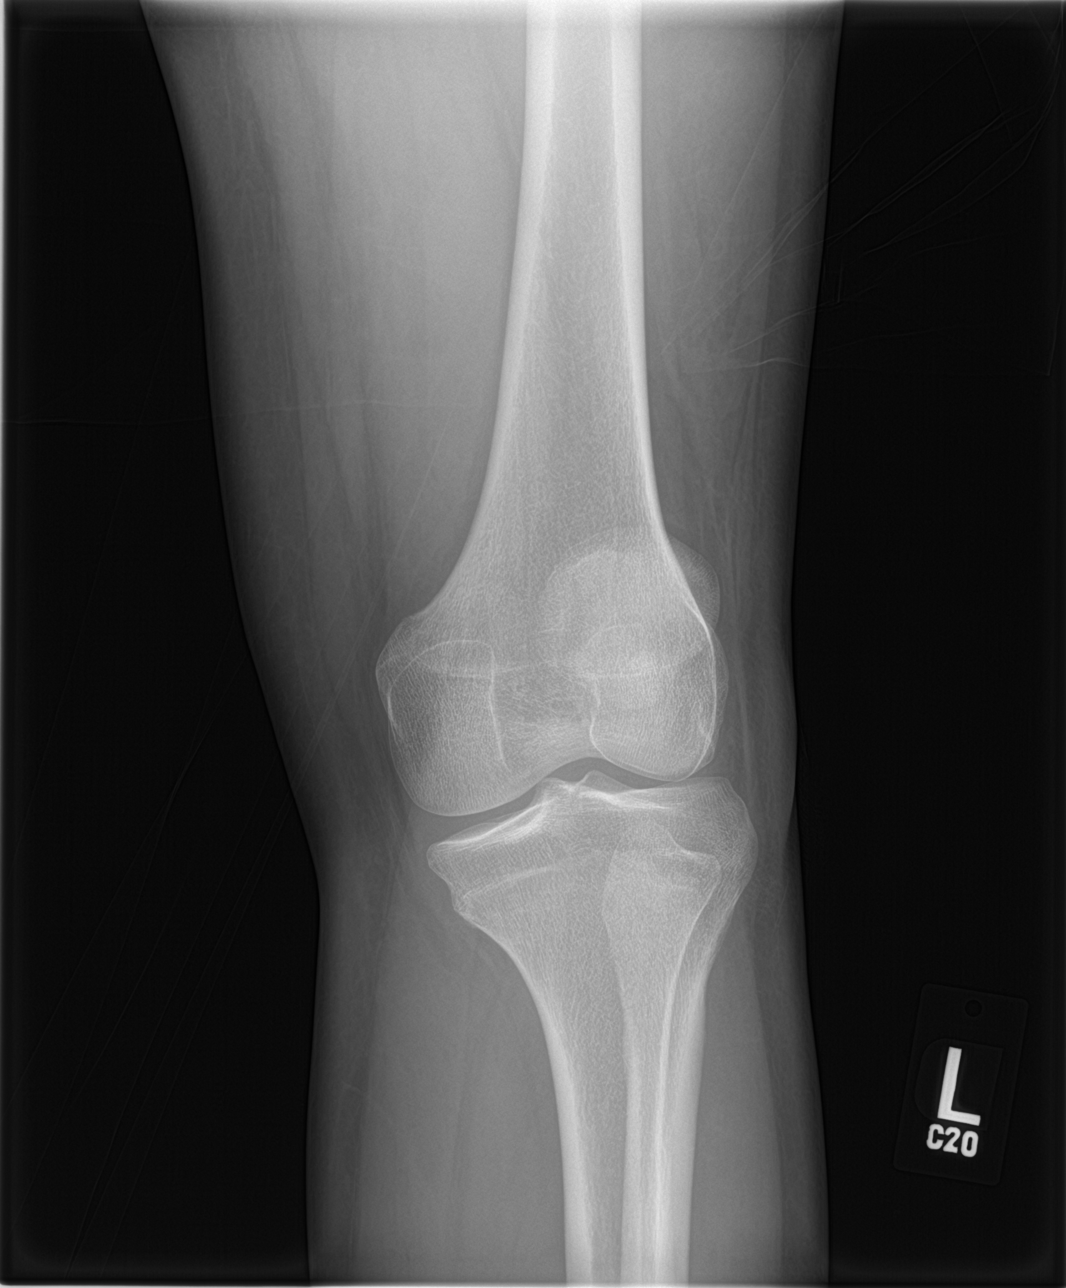

[knee obl (2 of 2)]
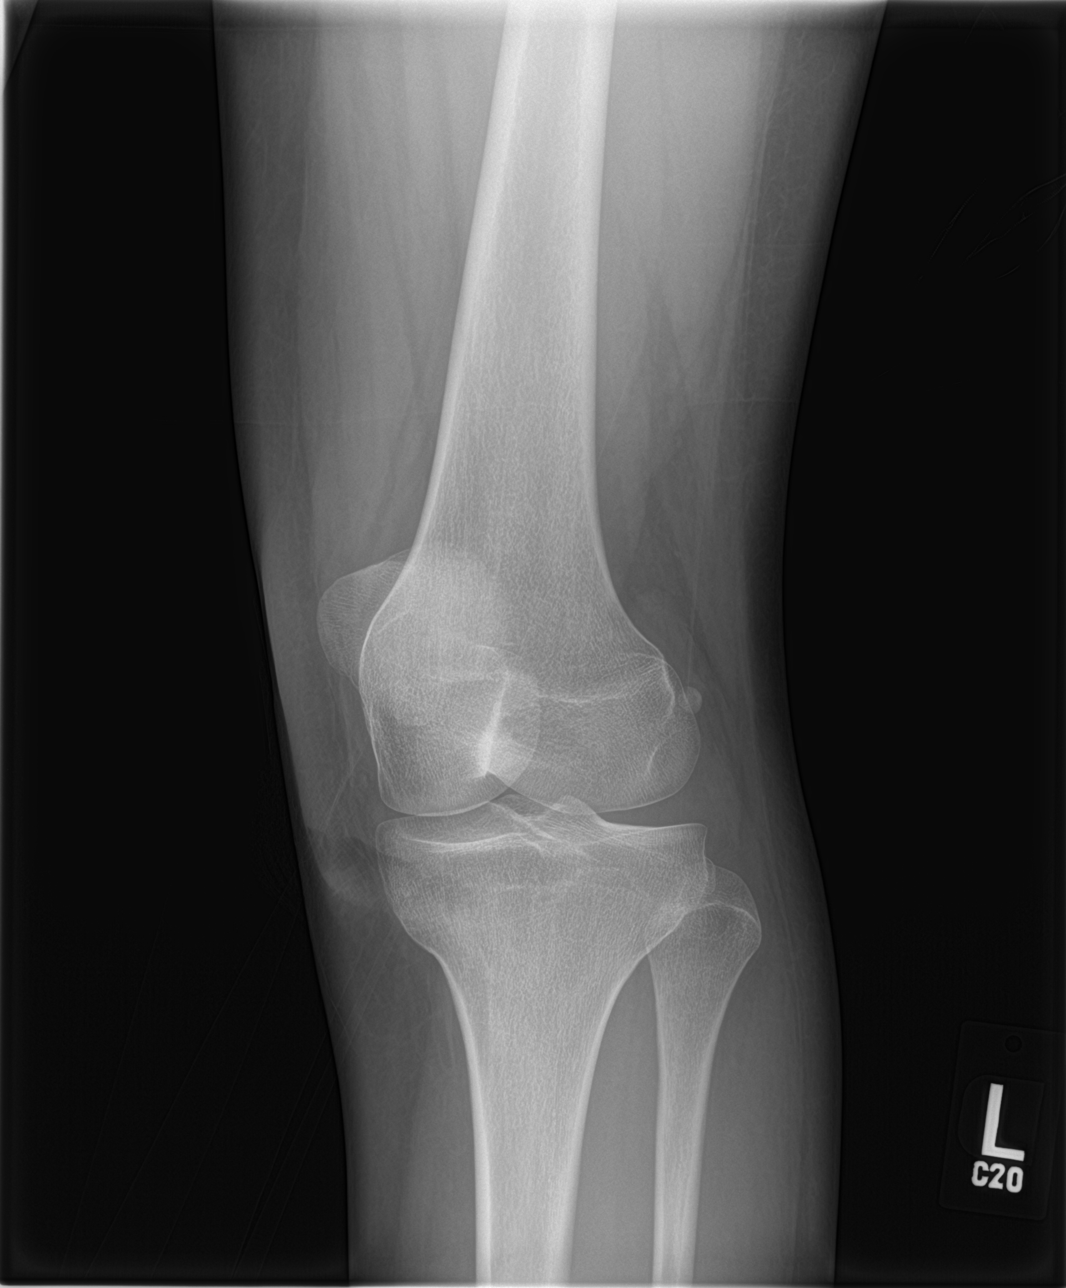

[4 of 4 positions shown; findings below may reference images not displayed]

FINDINGS: No evidence of fracture, dislocation, or joint effusion. No evidence
of arthropathy or other focal bone abnormality. A 0.8 cm x 1.1 cm
superficial soft tissue defect is seen along the anterior aspect of
the left knee, below the left patella. No radiopaque soft tissue
foreign bodies are identified.
IMPRESSION: 1. Superficial soft tissue laceration seen inferior to the left
patella, without evidence of an acute osseous abnormality or
radiopaque soft tissue foreign body.

## 2022-03-29 ENCOUNTER — Ambulatory Visit (INDEPENDENT_AMBULATORY_CARE_PROVIDER_SITE_OTHER): Payer: Self-pay | Admitting: Family

## 2022-03-29 ENCOUNTER — Encounter (INDEPENDENT_AMBULATORY_CARE_PROVIDER_SITE_OTHER): Payer: Self-pay

## 2022-03-29 VITALS — BP 127/82 | HR 82 | Temp 98.3°F | Resp 16 | Ht 63.0 in | Wt 117.0 lb

## 2022-03-29 DIAGNOSIS — M25531 Pain in right wrist: Secondary | ICD-10-CM

## 2022-03-29 DIAGNOSIS — M25532 Pain in left wrist: Secondary | ICD-10-CM

## 2022-03-29 DIAGNOSIS — G629 Polyneuropathy, unspecified: Secondary | ICD-10-CM

## 2022-03-29 DIAGNOSIS — R251 Tremor, unspecified: Secondary | ICD-10-CM

## 2022-03-29 DIAGNOSIS — R0789 Other chest pain: Secondary | ICD-10-CM

## 2022-03-29 LAB — VH CHEM8 POCT
Anion Gap POCT: 18 mmol/L — AB (ref 7.0–16.0)
BUN POCT: 9 mg/dL (ref 7.0–22.0)
CO2 POCT: 26 mmol/L (ref 24.0–29.0)
Calcium Ionized POCT: 4.8 mg/dL (ref 4.35–5.10)
Chloride POCT: 100 mmol/L (ref 98–110)
Creatinine POCT: 0.5 mg/dL — AB (ref 0.60–1.20)
Glucose POCT: 93 mg/dL (ref 71–99)
Potassium POCT: 4 mmol/L (ref 3.5–5.3)
Sodium POCT: 139 mmol/L (ref 136–147)

## 2022-03-29 LAB — VH UCC CBC POCT
VH UCC # GR, POCT: 5.5 10*9/L (ref 1.2–8.0)
VH UCC # LYMPH, POCT: 1.2 10*9/L (ref 0.5–5.0)
VH UCC # MONO, POCT: 1.4 10*9/L (ref 0.1–1.5)
VH UCC % GR, POCT: 78.4 % (ref 35–80)
VH UCC % LYMPH, POCT: 17.2 % (ref 15–50)
VH UCC % MONO, POCT: 4.4 % (ref 2–15)
VH UCC HCT, POCT: 44.6 % (ref 35.0–55.0)
VH UCC HGB, POCT: 15.5 g/dL (ref 11.5–16.5)
VH UCC MCH, POCT: 29.8 pg (ref 25.0–35.0)
VH UCC MCHC, POCT: 34.8 g/dL (ref 31.0–38.0)
VH UCC MCV, POCT: 85.6 fL (ref 75.0–100)
VH UCC MPV, POCT: 9 fL (ref 8–11)
VH UCC PLT, POCT: 233 10*9/L (ref 100–400)
VH UCC RBC, POCT: 5.21 10*12/L (ref 3.5–5.5)
VH UCC RDW, POCT: 11.7 % (ref 11–16)
VH UCC WBC, POCT: 7.1 10*9/L (ref 3.5–10)

## 2022-03-29 NOTE — Progress Notes (Addendum)
Subjective:    Patient ID: Bonnie Smith is a 21 y.o. female.    20 year old female presents here with some chronic symptoms.  She states she has had some "shaking" in her hands for the past 3 years, but it has become a little more noticeable the past couple months.  She states she saw a PCP a few years ago, but was told that it was due to her anxiety and nothing else was done.  She states she has a history of anxiety and depression.  In her chart it states that she has been on Celexa and Atarax, but she states she has not been on these 2 medications for the past 2 years after she moved back here from [place]. She states she does feel slightly anxious, but denies any thoughts of suicide or homicidal ideation.  She states she has also had numbness and tingling in both her hands with very mild pain in her wrist, which has been going on for longer than 6 months.  She has also had intermittent chest tightness and numbness and tingling from her upper chest down into her legs for the past couple of years, which has never been addressed by anyone before.  She denies any chest tightness, numbness, or tingling in the chest or lower extremities at present.  She states the symptoms come on randomly.    She states her symptoms became more noticeable recently, so she decided to come here to be evaluated.Patient is self-pay and does not currently have a PCP.     I used mask, face shield, and gloves for PPE. Moody Bruins, FNP.      The following portions of the patient's history were reviewed and updated as appropriate: allergies, current medications, past family history, past medical history, past social history, past surgical history, and problem list.    Review of Systems   Constitutional:  Negative for activity change, appetite change, chills, diaphoresis, fatigue, fever and unexpected weight change.   HENT:  Negative for congestion, ear discharge, ear pain, facial swelling, hearing loss, mouth sores, nosebleeds,  postnasal drip, rhinorrhea, sinus pressure, sinus pain, sneezing, sore throat, tinnitus, trouble swallowing and voice change.    Eyes:  Negative for photophobia, pain, discharge, redness, itching and visual disturbance.   Respiratory:  Positive for chest tightness. Negative for cough, shortness of breath, wheezing and stridor.    Cardiovascular:  Negative for chest pain, palpitations and leg swelling.   Gastrointestinal:  Negative for abdominal distention, abdominal pain, blood in stool, constipation, diarrhea, nausea and vomiting.   Genitourinary:  Negative for decreased urine volume, difficulty urinating, dysuria, flank pain, frequency, hematuria, pelvic pain and urgency.   Musculoskeletal:  Positive for arthralgias (wrist pain). Negative for back pain, gait problem, joint swelling, myalgias, neck pain and neck stiffness.   Skin:  Negative for color change, pallor and rash.   Neurological:  Positive for tremors and numbness. Negative for dizziness, seizures, syncope, facial asymmetry, speech difficulty, weakness, light-headedness and headaches.   Hematological:  Negative for adenopathy.   Psychiatric/Behavioral:  Negative for agitation, behavioral problems, confusion, hallucinations, sleep disturbance and suicidal ideas. The patient is nervous/anxious.          Objective:    BP 127/82 (BP Site: Left arm, Patient Position: Sitting, Cuff Size: Medium)   Pulse 82   Temp 98.3 F (36.8 C) (Oral)   Resp 16   Ht 1.6 m (5\' 3" )   Wt 53.1 kg (117 lb)   SpO2 100%  BMI 20.73 kg/m     Physical Exam  Vitals reviewed.   Constitutional:       General: She is not in acute distress.     Appearance: She is not ill-appearing, toxic-appearing or diaphoretic.   HENT:      Head: Normocephalic.      Right Ear: Tympanic membrane, ear canal and external ear normal. There is no impacted cerumen.      Left Ear: Tympanic membrane, ear canal and external ear normal. There is no impacted cerumen.      Nose: Nose normal. No congestion  or rhinorrhea.      Right Sinus: No maxillary sinus tenderness or frontal sinus tenderness.      Left Sinus: No maxillary sinus tenderness or frontal sinus tenderness.      Mouth/Throat:      Lips: Pink. No lesions.      Mouth: Mucous membranes are moist. No angioedema.      Tongue: No lesions. Tongue does not deviate from midline.      Palate: No mass and lesions.      Pharynx: Oropharynx is clear. Uvula midline. No pharyngeal swelling, oropharyngeal exudate, posterior oropharyngeal erythema or uvula swelling.      Tonsils: No tonsillar exudate or tonsillar abscesses.   Eyes:      General: No scleral icterus.        Right eye: No discharge.         Left eye: No discharge.      Extraocular Movements: Extraocular movements intact.      Conjunctiva/sclera: Conjunctivae normal.      Pupils: Pupils are equal, round, and reactive to light.   Cardiovascular:      Rate and Rhythm: Normal rate and regular rhythm.      Pulses: Normal pulses.      Heart sounds: Normal heart sounds.   Pulmonary:      Effort: Pulmonary effort is normal. No respiratory distress.      Breath sounds: Normal breath sounds. No stridor. No wheezing, rhonchi or rales.   Chest:      Chest wall: No tenderness.   Abdominal:      General: Abdomen is flat. Bowel sounds are normal. There is no distension.      Palpations: Abdomen is soft. There is no mass.      Tenderness: There is no abdominal tenderness. There is no right CVA tenderness, left CVA tenderness, guarding or rebound.      Hernia: No hernia is present.   Musculoskeletal:         General: No swelling, tenderness, deformity or signs of injury. Normal range of motion.      Cervical back: Normal, normal range of motion and neck supple. No rigidity or tenderness. No muscular tenderness.      Thoracic back: Normal.      Lumbar back: Normal.      Right lower leg: No edema.      Left lower leg: No edema.      Comments: Phalen's test was negative. FROM in upper and lower extremities.  No pain in  bilateral shoulders, elbows, or wrists. 5/5 strength in bilateral hands and lower legs.  2+ radial pulse bilaterally.  2+ posterior tibial pulse bilaterally.   Lymphadenopathy:      Cervical: No cervical adenopathy.   Skin:     General: Skin is warm.      Capillary Refill: Capillary refill takes less than 2 seconds.      Coloration:  Skin is not jaundiced or pale.      Findings: No bruising, erythema, lesion or rash.   Neurological:      General: No focal deficit present.      Mental Status: She is alert and oriented to person, place, and time.      GCS: GCS eye subscore is 4. GCS verbal subscore is 5. GCS motor subscore is 6.      Cranial Nerves: Cranial nerves 2-12 are intact. No cranial nerve deficit, dysarthria or facial asymmetry.      Sensory: Sensation is intact. No sensory deficit.      Motor: Tremor (Very mild shaking noted in both hands) present. No weakness, seizure activity or pronator drift.      Coordination: Coordination is intact. Romberg sign negative. Coordination normal.      Gait: Gait is intact. Gait normal.      Deep Tendon Reflexes: Reflexes normal.   Psychiatric:         Attention and Perception: Attention and perception normal.         Mood and Affect: Mood is anxious (Patient appears nervous when she talks and her hands shake a little; she has a nervous laugh at times). Mood is not depressed or elated. Affect is not labile, blunt, flat, angry, tearful or inappropriate.         Speech: Speech normal.         Behavior: Behavior normal.         Thought Content: Thought content normal. Thought content is not paranoid or delusional. Thought content does not include homicidal or suicidal ideation. Thought content does not include homicidal or suicidal plan.         Cognition and Memory: Cognition and memory normal.         Judgment: Judgment normal.         Lab Results last 48 Hours       Procedure Component Value Units Date/Time    POCT UCC CBC [[id number]]  (Normal) Collected: 03/29/22 1141      Updated: 03/29/22 1155     VH UCC WBC, POCT 7.1 x 10^9/L      VH UCC % LYMPH, POCT 17.2 %      VH UCC % MONO, POCT 4.4 %      V[facility] UCC % GR, POCT 78.4 %      V[facility] UCC # LYMPH, POCT 1.2 x 10^9/L      [facility] UCC # MONO, POCT 1.4 x 10^9/L      V[facility] UCC # GR, POCT 5.5 x 10^9/L      VH UCC RBC, POCT 5.21 x 10^12/L      VH UCC HGB, POCT 15.5 g/dL      VH UCC HCT, POCT 96.2 %      VH UCC MCV, POCT 85.6 fL      VH UCC MCH, POCT 29.8 pg      V[facility] UCC MCHC, POCT 34.8 g/dL      VH UCC RDW, POCT 95.2 %      VH UCC PLT, POCT 233 x 10^9/L      VH UCC MPV, POCT 9.0 fL     Chem8 POCT [[id number]]  (Abnormal) Collected: 03/29/22 1141     Updated: 03/29/22 1152     Sodium POCT 139 mmol/L      Potassium POCT 4.0 mmol/L      Chloride POCT 100 mmol/L      Calcium Ionized POCT 4.8 mg/dL  CO2 POCT 26 mmol/L      Glucose POCT 93 mg/dL      BUN POCT 9 mg/dL      Creatinine POCT 0.5 mg/dL      Anion Gap POCT 18 mmol/L             Assessment and Plan:       Victorino DikeJennifer was seen today for chest pain.    Diagnoses and all orders for this visit:    Neuropathy  -     Chem8 POCT  -     POCT UCC CBC  -     Referral to Neurology; Future    Chest tightness    Pain in both wrists    Occasional tremors    Blood work was unremarkable.  I wanted to obtain an EKG, but patient declined. I informed her that I cannot rule out any cardiac causes of her symptoms. I talked to her about possible carpal tunnel in her wrists and possible nerve impingement in her neck or shoulders that could be causing the numbness and tingling.  However, she has no pain or tenderness to her upper extremities. I am referring patient to a neurologist for the neuropathy.  I gave her a list of all the PCPs in the area along with information to the [facility] clinic in [place].  I also instructed her to contact the [facility] and the [facility] in [place] as they are for low income individuals and may be able to assist her as she stated she is low-income.  I instructed her to establish with a PCP as soon as possible and get generalized blood work, which we cannot do here.  I am limited on what we can do here.  Go to the nearest ER if symptoms become severe.  Patient expressed understanding and agreed with plan of care at time of discharge. Patient walked out of the office, awake and alert, in no acute distress.      Moody Bruinshinor Hasnain Manheim, NP  [facility]  03/29/2022  9:23 PM
